# Patient Record
Sex: Female | Born: 1965 | Race: White | Hispanic: Yes | Marital: Married | Smoking: Never smoker
Health system: Southern US, Community
[De-identification: ages and names within clinical notes are randomized; demographics above are authoritative.]

## PROBLEM LIST (undated history)

## (undated) DIAGNOSIS — E119 Type 2 diabetes mellitus without complications: Secondary | ICD-10-CM

## (undated) DIAGNOSIS — E7800 Pure hypercholesterolemia, unspecified: Secondary | ICD-10-CM

## (undated) DIAGNOSIS — I10 Essential (primary) hypertension: Secondary | ICD-10-CM

## (undated) HISTORY — PX: KNEE SURGERY: SHX244

## (undated) HISTORY — DX: Essential (primary) hypertension: I10

## (undated) HISTORY — PX: ABDOMINAL HYSTERECTOMY: SHX81

## (undated) HISTORY — DX: Pure hypercholesterolemia, unspecified: E78.00

## (undated) HISTORY — DX: Type 2 diabetes mellitus without complications: E11.9

---

## 2004-07-25 ENCOUNTER — Ambulatory Visit (HOSPITAL_BASED_OUTPATIENT_CLINIC_OR_DEPARTMENT_OTHER): Admission: RE | Admit: 2004-07-25 | Discharge: 2004-07-25 | Payer: Self-pay | Admitting: Orthopedic Surgery

## 2004-07-25 ENCOUNTER — Ambulatory Visit (HOSPITAL_COMMUNITY): Admission: RE | Admit: 2004-07-25 | Discharge: 2004-07-25 | Payer: Self-pay | Admitting: Orthopedic Surgery

## 2015-02-24 DIAGNOSIS — K2960 Other gastritis without bleeding: Secondary | ICD-10-CM | POA: Insufficient documentation

## 2015-02-24 DIAGNOSIS — J309 Allergic rhinitis, unspecified: Secondary | ICD-10-CM | POA: Insufficient documentation

## 2015-02-24 DIAGNOSIS — E785 Hyperlipidemia, unspecified: Secondary | ICD-10-CM | POA: Insufficient documentation

## 2015-02-24 DIAGNOSIS — I10 Essential (primary) hypertension: Secondary | ICD-10-CM | POA: Insufficient documentation

## 2015-02-24 DIAGNOSIS — F324 Major depressive disorder, single episode, in partial remission: Secondary | ICD-10-CM | POA: Insufficient documentation

## 2015-02-24 DIAGNOSIS — F4310 Post-traumatic stress disorder, unspecified: Secondary | ICD-10-CM | POA: Insufficient documentation

## 2015-02-24 DIAGNOSIS — F419 Anxiety disorder, unspecified: Secondary | ICD-10-CM | POA: Insufficient documentation

## 2016-03-28 DIAGNOSIS — R519 Headache, unspecified: Secondary | ICD-10-CM | POA: Insufficient documentation

## 2016-03-28 DIAGNOSIS — R21 Rash and other nonspecific skin eruption: Secondary | ICD-10-CM | POA: Insufficient documentation

## 2016-10-11 DIAGNOSIS — G5612 Other lesions of median nerve, left upper limb: Secondary | ICD-10-CM | POA: Insufficient documentation

## 2017-06-27 DIAGNOSIS — L858 Other specified epidermal thickening: Secondary | ICD-10-CM | POA: Insufficient documentation

## 2017-08-07 DIAGNOSIS — R928 Other abnormal and inconclusive findings on diagnostic imaging of breast: Secondary | ICD-10-CM | POA: Insufficient documentation

## 2017-11-07 DIAGNOSIS — M654 Radial styloid tenosynovitis [de Quervain]: Secondary | ICD-10-CM | POA: Insufficient documentation

## 2018-01-06 DIAGNOSIS — G5601 Carpal tunnel syndrome, right upper limb: Secondary | ICD-10-CM | POA: Insufficient documentation

## 2018-03-19 DIAGNOSIS — Z4789 Encounter for other orthopedic aftercare: Secondary | ICD-10-CM | POA: Insufficient documentation

## 2018-08-07 DIAGNOSIS — E669 Obesity, unspecified: Secondary | ICD-10-CM | POA: Insufficient documentation

## 2018-08-07 DIAGNOSIS — E1169 Type 2 diabetes mellitus with other specified complication: Secondary | ICD-10-CM | POA: Insufficient documentation

## 2018-10-05 DIAGNOSIS — M25511 Pain in right shoulder: Secondary | ICD-10-CM | POA: Insufficient documentation

## 2020-01-04 DIAGNOSIS — R1012 Left upper quadrant pain: Secondary | ICD-10-CM | POA: Insufficient documentation

## 2020-02-02 DIAGNOSIS — J4520 Mild intermittent asthma, uncomplicated: Secondary | ICD-10-CM | POA: Insufficient documentation

## 2020-02-25 DIAGNOSIS — U099 Post covid-19 condition, unspecified: Secondary | ICD-10-CM | POA: Insufficient documentation

## 2020-02-25 DIAGNOSIS — Z803 Family history of malignant neoplasm of breast: Secondary | ICD-10-CM | POA: Insufficient documentation

## 2020-02-25 DIAGNOSIS — L910 Hypertrophic scar: Secondary | ICD-10-CM | POA: Insufficient documentation

## 2020-03-02 DIAGNOSIS — K591 Functional diarrhea: Secondary | ICD-10-CM | POA: Insufficient documentation

## 2020-03-03 DIAGNOSIS — K760 Fatty (change of) liver, not elsewhere classified: Secondary | ICD-10-CM | POA: Insufficient documentation

## 2020-04-04 ENCOUNTER — Other Ambulatory Visit: Payer: Self-pay | Admitting: Orthopaedic Surgery

## 2020-04-04 DIAGNOSIS — M2391 Unspecified internal derangement of right knee: Secondary | ICD-10-CM

## 2020-04-04 DIAGNOSIS — S8990XA Unspecified injury of unspecified lower leg, initial encounter: Secondary | ICD-10-CM

## 2020-04-23 ENCOUNTER — Ambulatory Visit
Admission: RE | Admit: 2020-04-23 | Discharge: 2020-04-23 | Disposition: A | Discharge status: Home / Self Care | Payer: Self-pay | Source: Ambulatory Visit | Attending: Orthopaedic Surgery | Admitting: Orthopaedic Surgery

## 2020-04-23 ENCOUNTER — Other Ambulatory Visit: Payer: Self-pay

## 2020-04-23 DIAGNOSIS — M2391 Unspecified internal derangement of right knee: Secondary | ICD-10-CM

## 2020-04-23 DIAGNOSIS — S8990XA Unspecified injury of unspecified lower leg, initial encounter: Secondary | ICD-10-CM

## 2020-05-16 DIAGNOSIS — G8929 Other chronic pain: Secondary | ICD-10-CM | POA: Insufficient documentation

## 2020-08-28 DIAGNOSIS — R739 Hyperglycemia, unspecified: Secondary | ICD-10-CM | POA: Insufficient documentation

## 2020-08-28 DIAGNOSIS — B351 Tinea unguium: Secondary | ICD-10-CM | POA: Insufficient documentation

## 2020-08-28 DIAGNOSIS — Z794 Long term (current) use of insulin: Secondary | ICD-10-CM | POA: Insufficient documentation

## 2020-09-03 DIAGNOSIS — R55 Syncope and collapse: Secondary | ICD-10-CM

## 2020-09-05 DIAGNOSIS — G459 Transient cerebral ischemic attack, unspecified: Secondary | ICD-10-CM | POA: Insufficient documentation

## 2020-09-05 DIAGNOSIS — E1165 Type 2 diabetes mellitus with hyperglycemia: Secondary | ICD-10-CM | POA: Insufficient documentation

## 2020-10-06 ENCOUNTER — Ambulatory Visit: Payer: BC Managed Care – PPO | Admitting: Neurology

## 2020-10-06 ENCOUNTER — Telehealth: Payer: Self-pay | Admitting: Neurology

## 2020-10-06 NOTE — Telephone Encounter (Signed)
FYI- pt called had to reschedule her appt, not feeling well today.

## 2020-10-18 ENCOUNTER — Ambulatory Visit: Payer: BC Managed Care – PPO | Admitting: Neurology

## 2020-10-18 ENCOUNTER — Encounter: Payer: Self-pay | Admitting: Neurology

## 2020-10-18 VITALS — BP 135/76 | HR 85 | Ht 62.0 in | Wt 208.0 lb

## 2020-10-18 DIAGNOSIS — G459 Transient cerebral ischemic attack, unspecified: Secondary | ICD-10-CM | POA: Diagnosis not present

## 2020-10-18 DIAGNOSIS — R739 Hyperglycemia, unspecified: Secondary | ICD-10-CM | POA: Diagnosis not present

## 2020-10-18 MED ORDER — ASPIRIN EC 81 MG PO TBEC: 81.0000 mg | DELAYED_RELEASE_TABLET | Freq: Every day | ORAL | 11 refills | Status: AC

## 2020-10-18 MED ORDER — ATORVASTATIN CALCIUM 40 MG PO TABS: 80.0000 mg | ORAL_TABLET | Freq: Every day | ORAL | 11 refills | Status: AC

## 2020-10-18 NOTE — Patient Instructions (Addendum)
Discontinue Clopidogrel  Start Aspirin 81 mg daily  Increase Atorvastatin to 80 mg nightly  Continue your other medications  Return to clinic in 6 months or sooner if worse

## 2020-10-18 NOTE — Progress Notes (Signed)
[facility]  PATIENT: Zane Samson DOB: Feb 14, 1965  REFERRING CLINICIAN: Olivia Mackie, MD HISTORY FROM: Patient and spouse via interpreter  REASON FOR VISIT: TIA follow up hospital admission.    HISTORICAL  CHIEF COMPLAINT:  Chief Complaint  Patient presents with   New Patient (Initial Visit)    Rm 12, with interpreter and mother, states she has headaches    HISTORY OF PRESENT ILLNESS:  This is a 55 year old woman with past medical history of diabetes mellitus type 2, hypertension, hyperlipidemia who presented to clinic after recent admission to the hospital for right sided weakness and diagnosed with TIA.  Patient stated that she fainted on August 13. She was outside her house, right leg was weaker than usual, (baseline weakness after right knee surgery ), she was walking toward the laundry room, had no strength on right leg, could not hold and fell to the ground. Woke up, does not remember how long she was out, went into the house and check her sugar and it was 540, daughter took her to the hospital. She reports right sided weakness, and right face tingling.  In the hospital she had a Noncon head CT which was negative, carotid ultrasound which was negative, she had MRI which was negative for any acute stroke and a TTE that was negative with a EF of 55 to 60%.  Her stroke lab shows a globin A1c of 10.3, triglyceride 384, cholesterol 216, LDL 103.2, HDL 36 She was in the hospital for for 3 days, then about 5 days later she thinks she got back to her normal self  Currently she reports that her speech is better, but still has slight headaches and she is more forgetful    OTHER MEDICAL CONDITIONS: Diabetes, HTN, HLD,    REVIEW OF SYSTEMS: Full 14 system review of systems performed and negative with exception of: as noted in the HPI  ALLERGIES: Allergies  Allergen Reactions   Latex Rash   Lisinopril Cough    HOME MEDICATIONS: Outpatient Medications Prior to Visit   Medication Sig Dispense Refill   candesartan (ATACAND) 16 MG tablet Take 16 mg by mouth daily.     fluticasone (FLONASE) 50 MCG/ACT nasal spray 1 spray by Each Nare route daily.     glimepiride (AMARYL) 4 MG tablet Take 1 tablet by mouth daily before breakfast.     glucose blood (ONETOUCH VERIO) test strip TEST BLOOD SUGAR EVERY DAY     glucose blood (ONETOUCH VERIO) test strip 1 each by Other route.     LEVEMIR FLEXTOUCH 100 UNIT/ML FlexTouch Pen Inject into the skin.     metFORMIN (GLUCOPHAGE-XR) 500 MG 24 hr tablet Take by mouth.     metoprolol succinate (TOPROL-XL) 25 MG 24 hr tablet Take 1 tablet by mouth daily.     NOVOLOG FLEXPEN 100 UNIT/ML FlexPen SMARTSIG:5-40 Unit(s) SUB-Q As Directed     pantoprazole (PROTONIX) 40 MG tablet Take 1 tablet by mouth daily.     TRULICITY 1.5 MG/0.5ML SOPN Inject into the skin.     atorvastatin (LIPITOR) 40 MG tablet Take 1 tablet by mouth daily.     clopidogrel (PLAVIX) 75 MG tablet Take 75 mg by mouth daily.     No facility-administered medications prior to visit.    PAST MEDICAL HISTORY: Past Medical History:  Diagnosis Date   Diabetes mellitus without complication (HCC)    High cholesterol    Hypertension     PAST SURGICAL HISTORY: Past Surgical History:  Procedure Laterality  Date   ABDOMINAL HYSTERECTOMY     KNEE SURGERY      FAMILY HISTORY: History reviewed. No pertinent family history.  SOCIAL HISTORY: Social History   Socioeconomic History   Marital status: Married    Spouse name: Not on file   Number of children: Not on file   Years of education: Not on file   Highest education level: Not on file  Occupational History   Occupation: [employer]  Tobacco Use   Smoking status: Never   Smokeless tobacco: Never  Substance and Sexual Activity   Alcohol use: Never   Drug use: Never   Sexual activity: Not on file  Other Topics Concern   Not on file  Social History Narrative   Not on file   Social Determinants  of Health   Financial Resource Strain: Not on file  Food Insecurity: Not on file  Transportation Needs: Not on file  Physical Activity: Not on file  Stress: Not on file  Social Connections: Not on file  Intimate Partner Violence: Not on file     PHYSICAL EXAM  GENERAL EXAM/CONSTITUTIONAL: Vitals:  Vitals:   10/18/20 1605  BP: 135/76  Pulse: 85  Weight: 208 lb (94.3 kg)  Height: 5\' 2"  (1.575 m)   Body mass index is 38.04 kg/m. Wt Readings from Last 3 Encounters:  10/18/20 208 lb (94.3 kg)   Patient is in no distress; well developed, nourished and groomed; neck is supple  EYES: Pupils round and reactive to light, Visual fields full to confrontation, Extraocular movements intacts,   MUSCULOSKELETAL: Gait, strength, tone, movements noted in Neurologic exam below  NEUROLOGIC: MENTAL STATUS:  No flowsheet data found. awake, alert, oriented to person, place and time recent and remote memory intact normal attention and concentration language fluent, comprehension intact, naming intact fund of knowledge appropriate  CRANIAL NERVE:  2nd, 3rd, 4th, 6th - pupils equal and reactive to light, visual fields full to confrontation, extraocular muscles intact, no nystagmus 5th - facial sensation symmetric 7th - facial strength symmetric 8th - hearing intact 9th - palate elevates symmetrically, uvula midline 11th - shoulder shrug symmetric 12th - tongue protrusion midline  MOTOR:  normal bulk and tone, slight weakness on confrontation on the right when compare to the left. (4+/5)  SENSORY:  Report decrease sensation to light touch on the RUE and RLE when compared to the left   COORDINATION:  finger-nose-finger, fine finger movements normal  REFLEXES:  deep tendon reflexes present and symmetric  GAIT/STATION:  normal     DIAGNOSTIC DATA (LABS, IMAGING, TESTING) - I reviewed patient records, labs, notes, testing and imaging myself where available.  No results  found for: WBC, HGB, HCT, MCV, PLT No results found for: NA, K, CL, CO2, GLUCOSE, BUN, CREATININE, CALCIUM, PROT, ALBUMIN, AST, ALT, ALKPHOS, BILITOT, GFRNONAA, GFRAA No results found for: CHOL, HDL, LDLCALC, LDLDIRECT, TRIG, CHOLHDL No results found for: 10/20/20 No results found for: VITAMINB12 No results found for: TSH  CT head reported normal MRI brain reported normal no acute infarct  Carotid ultrasound report reported normal no severe stenosis TTE with a EF of 55 to 60%    ASSESSMENT AND PLAN  55 y.o. year old female with past medical history of uncontrolled diabetes, hypertension, and hyperlipidemia who presented to the ED after a fall and right-sided weakness and tingling.  At that time, she was noted to be hyperglycemic to the 400s.  She had a full stroke work-up which was negative for any acute  stroke, her A1c was more than 10 and her LDL was also more than 100. Patient's symptoms at least as likely as not can be related to TIA versus hyperglycemia.  She is following up with her primary care doctor for better blood sugar management but I will have her continue aspirin 81 mg daily, increase her atorvastatin to 80 mg nightly and continue all other medications.  I will follow-up with her in 6 months at that time I will recheck the lipid panel and if we have a better LDL I will decrease her atorvastatin.  This was discussed with the patient and her daughter and they are both comfortable with plan.   1. TIA (transient ischemic attack)   2. Hyperglycemia     PLAN: Continue with Aspirin 81 mg daily  Increase Atorvastatin to 80 mg nightly, will recheck lipid panel at followup visit Continue your other medications  Return to clinic in 6 months or sooner if worse   No orders of the defined types were placed in this encounter.   Meds ordered this encounter  Medications   aspirin EC 81 MG tablet    Sig: Take 1 tablet (81 mg total) by mouth daily. Swallow whole.    Dispense:  30 tablet     Refill:  11   atorvastatin (LIPITOR) 40 MG tablet    Sig: Take 2 tablets (80 mg total) by mouth daily.    Dispense:  60 tablet    Refill:  11    Return in about 6 months (around 04/17/2021).    Windell Norfolk, MD 10/18/2020, 9:28 PM  [facility] [address] [place], [place] [address] [phone]

## 2020-11-17 ENCOUNTER — Ambulatory Visit: Payer: BC Managed Care – PPO | Admitting: Neurology

## 2020-12-21 ENCOUNTER — Other Ambulatory Visit: Payer: Self-pay | Admitting: Physician Assistant

## 2020-12-21 DIAGNOSIS — M7989 Other specified soft tissue disorders: Secondary | ICD-10-CM

## 2020-12-27 ENCOUNTER — Ambulatory Visit
Admission: RE | Admit: 2020-12-27 | Discharge: 2020-12-27 | Disposition: A | Discharge status: Home / Self Care | Payer: Worker's Compensation | Source: Ambulatory Visit | Attending: Physician Assistant | Admitting: Physician Assistant

## 2020-12-27 DIAGNOSIS — M7989 Other specified soft tissue disorders: Secondary | ICD-10-CM

## 2021-01-19 ENCOUNTER — Other Ambulatory Visit: Payer: Self-pay | Admitting: Orthopaedic Surgery

## 2021-01-19 DIAGNOSIS — M25561 Pain in right knee: Secondary | ICD-10-CM

## 2021-02-02 ENCOUNTER — Other Ambulatory Visit: Payer: Self-pay

## 2021-02-02 ENCOUNTER — Ambulatory Visit
Admission: RE | Admit: 2021-02-02 | Discharge: 2021-02-02 | Disposition: A | Discharge status: Home / Self Care | Payer: Worker's Compensation | Source: Ambulatory Visit | Attending: Orthopaedic Surgery | Admitting: Orthopaedic Surgery

## 2021-02-02 DIAGNOSIS — G8929 Other chronic pain: Secondary | ICD-10-CM

## 2021-02-08 DIAGNOSIS — R0789 Other chest pain: Secondary | ICD-10-CM | POA: Insufficient documentation

## 2021-03-13 DIAGNOSIS — M7661 Achilles tendinitis, right leg: Secondary | ICD-10-CM | POA: Insufficient documentation

## 2021-04-17 ENCOUNTER — Ambulatory Visit: Payer: BC Managed Care – PPO | Admitting: Neurology

## 2021-04-17 ENCOUNTER — Encounter: Payer: Self-pay | Admitting: Neurology

## 2021-04-17 VITALS — BP 141/90 | HR 83 | Ht 62.0 in | Wt 220.0 lb

## 2021-04-17 DIAGNOSIS — G44209 Tension-type headache, unspecified, not intractable: Secondary | ICD-10-CM

## 2021-04-17 DIAGNOSIS — G459 Transient cerebral ischemic attack, unspecified: Secondary | ICD-10-CM | POA: Diagnosis not present

## 2021-04-17 DIAGNOSIS — R200 Anesthesia of skin: Secondary | ICD-10-CM

## 2021-04-17 NOTE — Progress Notes (Signed)
? ?[facility] ? ?PATIENT: Courtney Bernard ?DOB: 1965-05-18 ? ?REFERRING CLINICIAN: Maris Berger, MD ?HISTORY FROM: Patient and spouse via interpreter  ?REASON FOR VISIT: TIA follow up hospital admission.  ? ? ?HISTORICAL ? ?CHIEF COMPLAINT:  ?Chief Complaint  ?Patient presents with  ? Follow-up  ?  Rm 14. Accompanied by daughter and interpreter. ?C/o right sided weakness. C/o numbness in bilateral hands radiating up arms, especially at night. ?C/o shooting pain in head on occasion, headache.  ? ?INTERVAL HISTORY 04/17/2021:  ?Patient presents today for follow-up, since last visit has she has been doing well.  She denies any episode of unilateral numbness.  She follow-up with her primary care doctor and currently she is on 2 insulin and metformin.  Reported her last A1c was about 7.1.  She is pending for another one.  Currently she does report occasional right-sided headache and numbness in both hands. She said the numbness is diffuse in both palms involving all 5 fingers. For the headache she has not started any medications. She does report a history of right wrist surgery but denies being diagnosed with carpal tunnel syndrome.   ? ? ?HISTORY OF PRESENT ILLNESS:  ?This is a 56 year old woman with past medical history of diabetes mellitus type 2, hypertension, hyperlipidemia who presented to clinic after recent admission to the hospital for right sided weakness and diagnosed with TIA.  Patient stated that she fainted on August 13. She was outside her house, right leg was weaker than usual, (baseline weakness after right knee surgery ), she was walking toward the laundry room, had no strength on right leg, could not hold and fell to the ground. Woke up, does not remember how long she was out, went into the house and check her sugar and it was 540, daughter took her to the hospital. She reports right sided weakness, and right face tingling.  In the hospital she had a Noncon head CT which was negative,  carotid ultrasound which was negative, she had MRI which was negative for any acute stroke and a TTE that was negative with a EF of 55 to 60%.  Her stroke lab shows a globin A1c of 10.3, triglyceride 384, cholesterol 216, LDL 103.2, HDL 36 ?She was in the hospital for for 3 days, then about 5 days later she thinks she got back to her normal self  ?Currently she reports that her speech is better, but still has slight headaches and she is more forgetful   ? ?OTHER MEDICAL CONDITIONS: Diabetes, HTN, HLD,  ? ? ?REVIEW OF SYSTEMS: Full 14 system review of systems performed and negative with exception of: as noted in the HPI ? ?ALLERGIES: ?Allergies  ?Allergen Reactions  ? Latex Rash  ? Cortisone Itching  ? Lisinopril Cough  ? Prednisone   ?  Other reaction(s): Other (See Comments), Tremor (intolerance) ?Anxiousness ?Other reaction(s): Other (See Comments), Tremor (intolerance) ?Anxiousness ?  ? ? ?HOME MEDICATIONS: ?Outpatient Medications Prior to Visit  ?Medication Sig Dispense Refill  ? candesartan (ATACAND) 16 MG tablet Take 16 mg by mouth daily.    ? glimepiride (AMARYL) 4 MG tablet Take 1 tablet by mouth daily before breakfast.    ? glucose blood (ONETOUCH VERIO) test strip TEST BLOOD SUGAR EVERY DAY    ? LEVEMIR FLEXTOUCH 100 UNIT/ML FlexTouch Pen Inject into the skin.    ? metFORMIN (GLUCOPHAGE-XR) 500 MG 24 hr tablet Take by mouth.    ? metoprolol succinate (TOPROL-XL) 25 MG 24 hr tablet Take  1 tablet by mouth daily.    ? NOVOLOG FLEXPEN 100 UNIT/ML FlexPen SMARTSIG:5-40 Unit(s) SUB-Q As Directed    ? pantoprazole (PROTONIX) 40 MG tablet Take 1 tablet by mouth daily.    ? atorvastatin (LIPITOR) 40 MG tablet Take 2 tablets (80 mg total) by mouth daily. 60 tablet 11  ? fluticasone (FLONASE) 50 MCG/ACT nasal spray 1 spray by Each Nare route daily.    ? glucose blood (ONETOUCH VERIO) test strip 1 each by Other route.    ? TRULICITY 1.5 0000000 SOPN Inject into the skin.    ? ?No facility-administered medications  prior to visit.  ? ? ?PAST MEDICAL HISTORY: ?Past Medical History:  ?Diagnosis Date  ? Diabetes mellitus without complication ([place])   ? High cholesterol   ? Hypertension   ? ? ?PAST SURGICAL HISTORY: ?Past Surgical History:  ?Procedure Laterality Date  ? ABDOMINAL HYSTERECTOMY    ? KNEE SURGERY    ? ? ?FAMILY HISTORY: ?History reviewed. No pertinent family history. ? ?SOCIAL HISTORY: ?Social History  ? ?Socioeconomic History  ? Marital status: Married  ?  Spouse name: Not on file  ? Number of children: Not on file  ? Years of education: Not on file  ? Highest education level: Not on file  ?Occupational History  ? Occupation: [employer]  ?Tobacco Use  ? Smoking status: Never  ? Smokeless tobacco: Never  ?Substance and Sexual Activity  ? Alcohol use: Never  ? Drug use: Never  ? Sexual activity: Not on file  ?Other Topics Concern  ? Not on file  ?Social History Narrative  ? Not on file  ? ?Social Determinants of Health  ? ?Financial Resource Strain: Not on file  ?Food Insecurity: Not on file  ?Transportation Needs: Not on file  ?Physical Activity: Not on file  ?Stress: Not on file  ?Social Connections: Not on file  ?Intimate Partner Violence: Not on file  ? ? ? ?PHYSICAL EXAM ? ?GENERAL EXAM/CONSTITUTIONAL: ?Vitals:  ?Vitals:  ? 04/17/21 1003  ?BP: (!) 141/90  ?Pulse: 83  ?Weight: 220 lb (99.8 kg)  ?Height: 5\' 2"  (1.575 m)  ? ?Body mass index is 40.24 kg/m?. ?Wt Readings from Last 3 Encounters:  ?04/17/21 220 lb (99.8 kg)  ?10/18/20 208 lb (94.3 kg)  ? ?Patient is in no distress; well developed, nourished and groomed; neck is supple ? ?EYES: ?Pupils round and reactive to light, Visual fields full to confrontation, Extraocular movements intacts,  ? ?MUSCULOSKELETAL: ?Gait, strength, tone, movements noted in Neurologic exam below ? ?NEUROLOGIC: ?MENTAL STATUS:  ?   ? View : No data to display.  ?  ?  ?  ? ?awake, alert, oriented to person, place and time ?recent and remote memory intact ?normal attention and  concentration ?language fluent, comprehension intact, naming intact ?fund of knowledge appropriate ? ?CRANIAL NERVE:  ?2nd, 3rd, 4th, 6th - pupils equal and reactive to light, visual fields full to confrontation, extraocular muscles intact, no nystagmus ?5th - facial sensation symmetric ?7th - facial strength symmetric ?8th - hearing intact ?9th - palate elevates symmetrically, uvula midline ?11th - shoulder shrug symmetric ?12th - tongue protrusion midline ? ?MOTOR:  ?normal bulk and tone ? ?COORDINATION:  ?finger-nose-finger, fine finger movements normal ? ?REFLEXES:  ?deep tendon reflexes present and symmetric ? ?GAIT/STATION:  ?normal ? ? ?DIAGNOSTIC DATA (LABS, IMAGING, TESTING) ?- I reviewed patient records, labs, notes, testing and imaging myself where available. ? ?No results found for: WBC, HGB, HCT, MCV, PLT ?No  results found for: NA, K, CL, CO2, GLUCOSE, BUN, CREATININE, CALCIUM, PROT, ALBUMIN, AST, ALT, ALKPHOS, BILITOT, GFRNONAA, GFRAA ?No results found for: CHOL, HDL, LDLCALC, LDLDIRECT, TRIG, CHOLHDL ?No results found for: HGBA1C ?No results found for: VITAMINB12 ?No results found for: TSH ? ?CT head reported normal ?MRI brain reported normal no acute infarct  ?Carotid ultrasound report reported normal no severe stenosis ?TTE with a EF of 55 to 60% ? ? ? ?ASSESSMENT AND PLAN ? ?56 y.o. year old female with past medical history of uncontrolled diabetes, hypertension, and hyperlipidemia who is presenting for follow up for her TIA sx. she denies any unilateral weakness or numbness but reports bilateral hand numbness and occasional headaches.  She reports history of right wrist surgery but never diagnosed with carpal tunnel syndrome.  At this point advised patient to start Tylenol or ibuprofen as needed for headaches and I will continue to monitor the numbness.  It may be secondary to her diabetes, however she does denies any numbness in her lower extremities.  I will also repeat the lipid panel, if  improvement of the LDL will decrease the atorvastatin to 40 mg daily. I will contact the patient to go over the result otherwise I will see her in 1 year for follow-up. ? ? ? ?1. TIA (transient ischemic attack)

## 2021-04-17 NOTE — Patient Instructions (Addendum)
Continue current medications  ?Will check lipid panel, I will contact you to go over the results  ?Continue with Tylenol or Ibuprofen as needed for the headaches  ?Follow up with your primary care physician  ?Return in 1 year or sooner worse ? ?

## 2021-04-18 ENCOUNTER — Telehealth: Payer: Self-pay

## 2021-04-18 LAB — LIPID PANEL
Chol/HDL Ratio: 3.2 ratio (ref 0.0–4.4)
Cholesterol, Total: 172 mg/dL (ref 100–199)
HDL: 53 mg/dL (ref >39)
LDL Chol Calc (NIH): 75 mg/dL (ref 0–99)
Triglycerides: 273 mg/dL — ABNORMAL HIGH (ref 0–149)
VLDL Cholesterol Cal: 44 mg/dL — ABNORMAL HIGH (ref 5–40)

## 2021-04-18 NOTE — Telephone Encounter (Addendum)
I called pt via telephone interpreter Byrd Hesselbach ID [phone]) and advised of labs and change in Lipitor ( ok per dpr). ? ?Pt advised to call back if she has questions/concerns.  ?

## 2021-04-18 NOTE — Progress Notes (Signed)
Please call and inform patient that the lipid profile improved, and to decrease the Lipitor to 40 mg nightly. ?

## 2021-04-18 NOTE — Telephone Encounter (Signed)
-----   Message from Alric Ran, MD sent at 04/18/2021  8:19 AM EDT ----- ?Please call and inform patient that the lipid profile improved, and to decrease the Lipitor to 40 mg nightly. ?  ?

## 2022-04-18 ENCOUNTER — Ambulatory Visit: Payer: BC Managed Care – PPO | Admitting: Neurology

## 2022-04-18 ENCOUNTER — Encounter: Payer: Self-pay | Admitting: Neurology

## 2022-11-17 IMAGING — MR MR KNEE*R* W/O CM
4 of 6 series · 19 of 40 positions shown · non-contrast
Comparison: None.

CLINICAL DATA: Status post fall at work 01/06/2021. Prior surgery
06/20/2020. Increased pain and popping.

EXAM:
MRI OF THE RIGHT KNEE WITHOUT CONTRAST
TECHNIQUE: Multiplanar, multisequence MR imaging of the knee was performed. No
intravenous contrast was administered.

[Series 3: T2 fat-sat · axial · 4.0mm · 0.29mm/px · z∈[-42,+55]mm · 3 of 28 slices shown (1 of 2)]
[im 6/28]
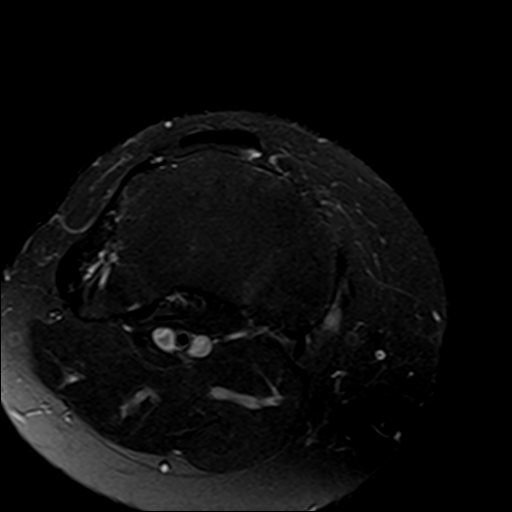
[im 17/28]
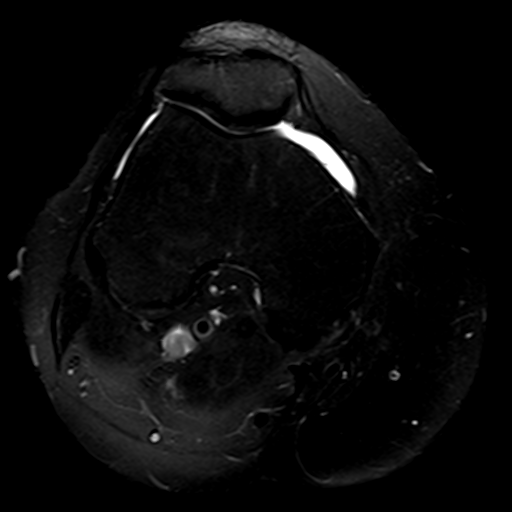
[im 28/28]
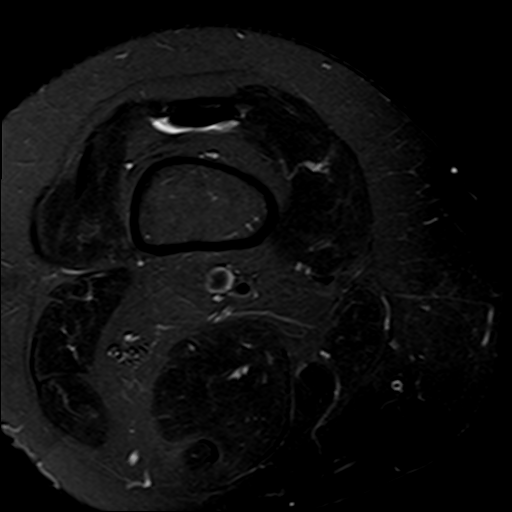

[Series 5: T2 fat-sat · coronal · 4.0mm · 0.29mm/px · 3 of 26 slices shown (2 of 2)]
[im 6/26]
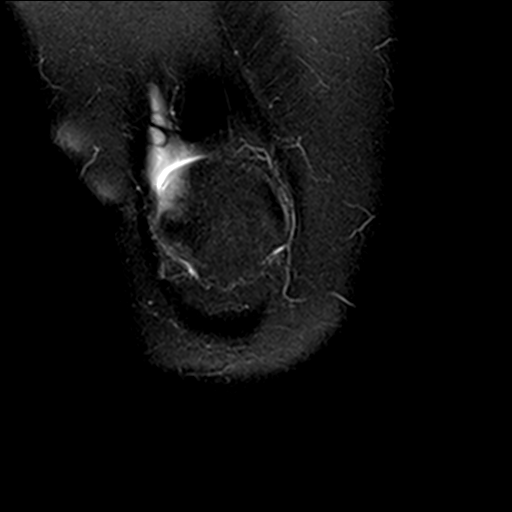
[im 16/26]
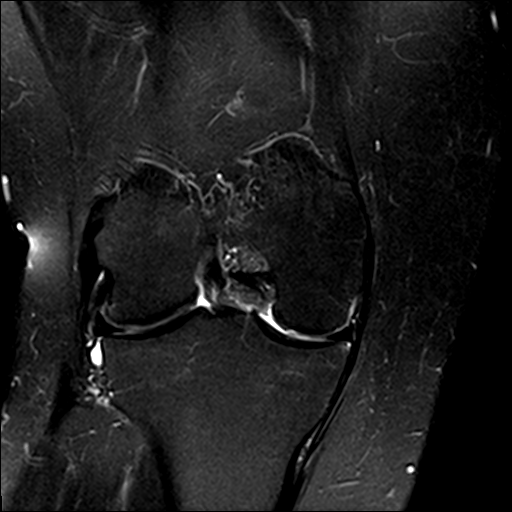
[im 26/26]
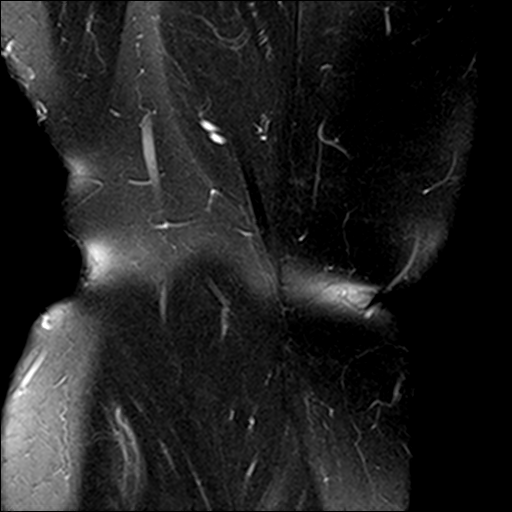

[Series 6: PD fat-sat · coronal · 3.0mm · 0.29mm/px · 8 of 32 slices shown (1 of 2)]
[im 1/32]
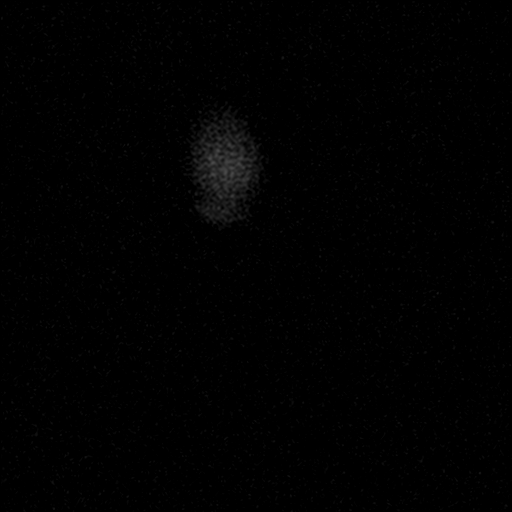
[im 5/32]
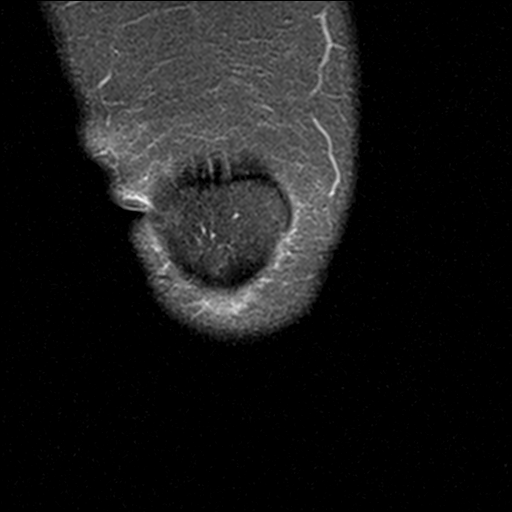
[im 9/32]
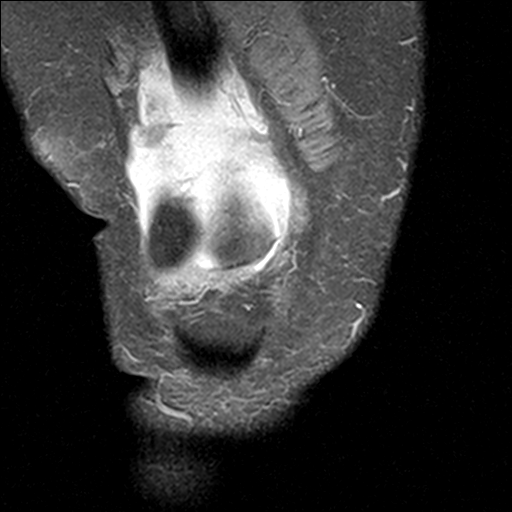
[im 14/32]
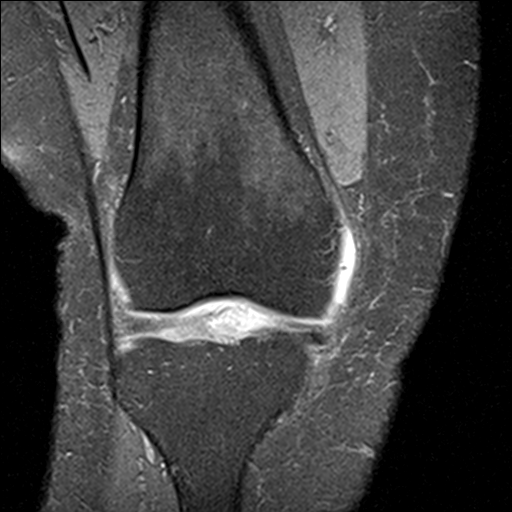
[im 18/32]
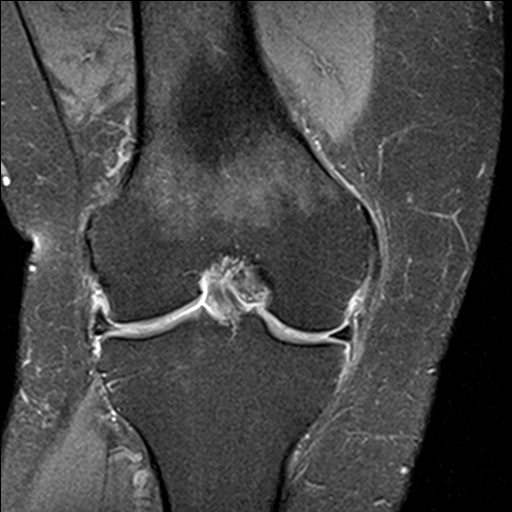
[im 23/32]
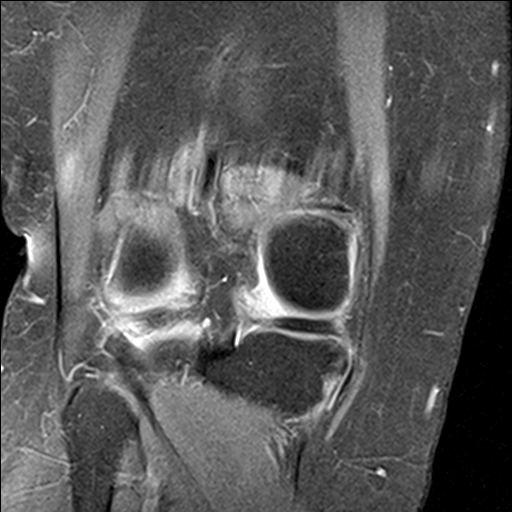
[im 27/32]
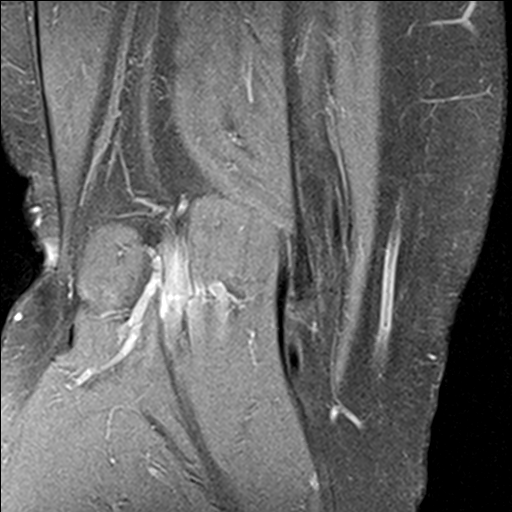
[im 32/32]
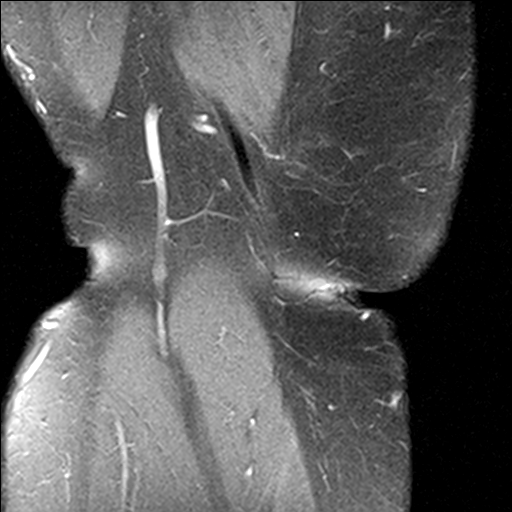

[Series 7: PD fat-sat · sagittal · 3.0mm · 0.29mm/px · 5 of 30 slices shown (2 of 2)]
[im 1/30]
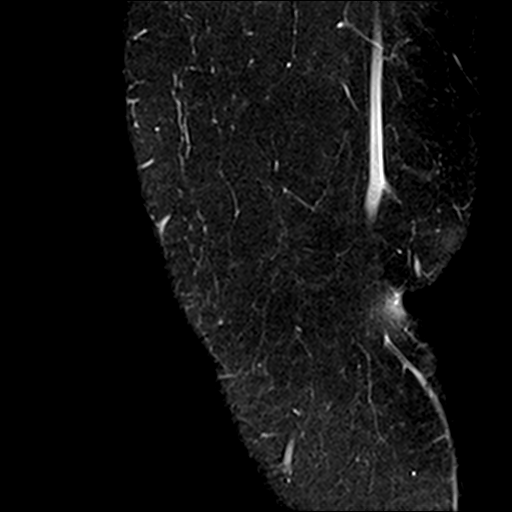
[im 5/30]
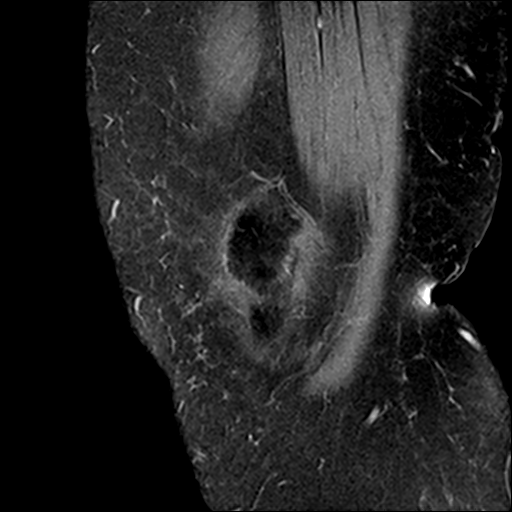
[im 10/30]
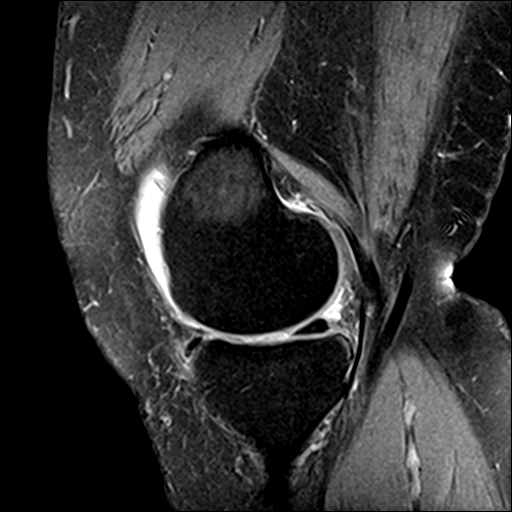
[im 15/30]
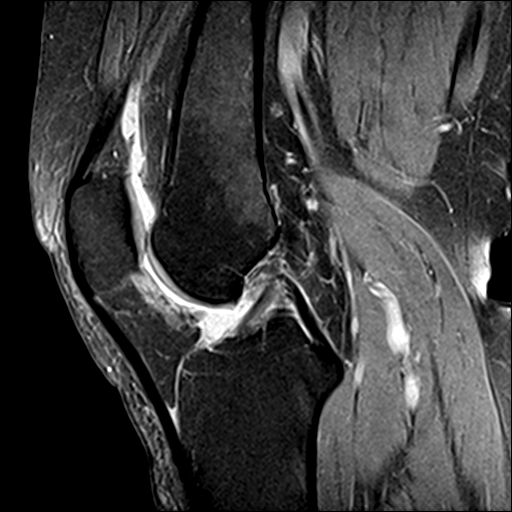
[im 25/30]
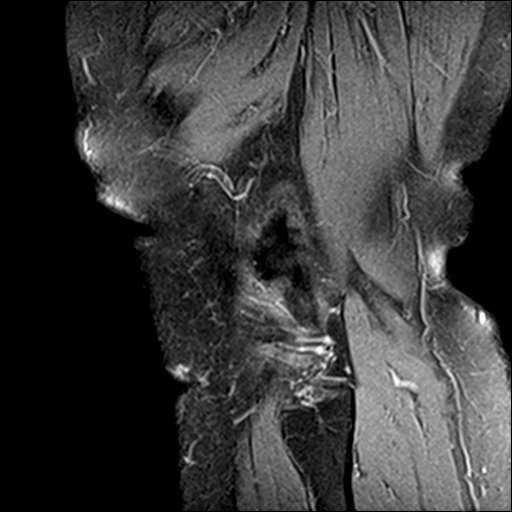

[19 of 40 positions shown; findings below may reference images not displayed]

FINDINGS: MENISCI

Medial: No discrete tear. Peripheral meniscal extrusion as can be
seen with loss of hoop strength.

Lateral: Attenuation of the anterior horn of the lateral meniscus
consistent with prior meniscectomy.

LIGAMENTS

Cruciates: ACL and PCL are intact.

Collaterals: Medial collateral ligament is intact. Lateral
collateral ligament complex is intact.

CARTILAGE

Patellofemoral: Partial-thickness cartilage loss of the lateral
patellofemoral compartment.

Medial: High-grade partial-thickness cartilage loss of the medial
femoral condyle. Partial-thickness cartilage loss of the medial
tibial plateau.

Lateral: Partial-thickness cartilage loss of the lateral
femorotibial compartment.

JOINT: Small joint effusion. Normal [place]. No plical
thickening.

POPLITEAL FOSSA: Popliteus tendon is intact. No Baker's cyst.

EXTENSOR MECHANISM: Intact quadriceps tendon. Intact patellar
tendon. Intact lateral patellar retinaculum. Intact medial patellar
retinaculum. Intact MPFL.

BONES: No aggressive osseous lesion. No fracture or dislocation.

Other: No fluid collection or hematoma. Muscles are normal.
IMPRESSION: 1. Tricompartmental cartilage abnormalities as described above.
2. Peripheral meniscal extrusion as can be seen with loss of hoop
strength.
3. Attenuation of the anterior horn of the lateral meniscus
consistent with prior meniscectomy.
